# Patient Record
Sex: Female | Born: 1980 | Race: Black or African American | Hispanic: No | Marital: Married | Smoking: Never smoker
Health system: Southern US, Community
[De-identification: ages and names within clinical notes are randomized; demographics above are authoritative.]

---

## 2016-04-08 ENCOUNTER — Emergency Department (HOSPITAL_COMMUNITY)
Admission: EM | Admit: 2016-04-08 | Discharge: 2016-04-08 | Disposition: A | Discharge status: Home / Self Care | Payer: Medicaid Other | Attending: Emergency Medicine | Admitting: Emergency Medicine

## 2016-04-08 ENCOUNTER — Emergency Department (HOSPITAL_COMMUNITY): Payer: Medicaid Other

## 2016-04-08 ENCOUNTER — Encounter (HOSPITAL_COMMUNITY): Payer: Self-pay | Admitting: Emergency Medicine

## 2016-04-08 DIAGNOSIS — N3001 Acute cystitis with hematuria: Secondary | ICD-10-CM

## 2016-04-08 DIAGNOSIS — M545 Low back pain, unspecified: Secondary | ICD-10-CM

## 2016-04-08 DIAGNOSIS — G8929 Other chronic pain: Secondary | ICD-10-CM | POA: Insufficient documentation

## 2016-04-08 LAB — BASIC METABOLIC PANEL WITH GFR
Anion gap: 10 (ref 5–15)
BUN: 12 mg/dL (ref 6–20)
CO2: 23 mmol/L (ref 22–32)
Calcium: 9.5 mg/dL (ref 8.9–10.3)
Chloride: 98 mmol/L — ABNORMAL LOW (ref 101–111)
Creatinine, Ser: 0.78 mg/dL (ref 0.44–1.00)
GFR calc Af Amer: >60 mL/min
GFR calc non Af Amer: >60 mL/min
Glucose, Bld: 109 mg/dL — ABNORMAL HIGH (ref 65–99)
Potassium: 3.8 mmol/L (ref 3.5–5.1)
Sodium: 131 mmol/L — ABNORMAL LOW (ref 135–145)

## 2016-04-08 LAB — URINALYSIS, ROUTINE W REFLEX MICROSCOPIC
Glucose, UA: NEGATIVE mg/dL
LEUKOCYTES UA: NEGATIVE
NITRITE: NEGATIVE
PH: 5.5 (ref 5.0–8.0)
Protein, ur: 30 mg/dL — AB

## 2016-04-08 LAB — URINE MICROSCOPIC-ADD ON

## 2016-04-08 LAB — PREGNANCY, URINE: Preg Test, Ur: NEGATIVE

## 2016-04-08 MED ORDER — TRAMADOL HCL 50 MG PO TABS: 50.0000 mg | ORAL_TABLET | Freq: Four times a day (QID) | ORAL | 0 refills | Status: AC | PRN

## 2016-04-08 MED ORDER — CEPHALEXIN 500 MG PO CAPS: 500.0000 mg | ORAL_CAPSULE | Freq: Four times a day (QID) | ORAL | 0 refills | Status: DC

## 2016-04-08 MED ORDER — TRAMADOL HCL 50 MG PO TABS: 50.0000 mg | ORAL_TABLET | Freq: Four times a day (QID) | ORAL | 0 refills | Status: DC | PRN

## 2016-04-08 MED ORDER — CEPHALEXIN 500 MG PO CAPS: 500.0000 mg | ORAL_CAPSULE | Freq: Three times a day (TID) | ORAL | 0 refills | Status: AC

## 2016-04-08 NOTE — Discharge Instructions (Addendum)
Please take the antibiotics 4 times a day for 7 days. Drink plenty of fluids. You may take the pain medicine as needed. You need to follow up next week with pcp for your urine to be rechecked.

## 2016-04-08 NOTE — ED Triage Notes (Signed)
Pt reports low back pain that she has had for over 6 months. Pt states she was seen by her PCP several months ago. Pt states symptoms have been becoming worse.

## 2016-04-10 LAB — URINE CULTURE

## 2016-04-17 NOTE — ED Provider Notes (Signed)
[facility]-EMERGENCY DEPT Provider Note   CSN: [id number] Arrival date & time: 04/08/16  1319     History   Chief Complaint Chief Complaint  Patient presents with  . Back Pain    HPI Margaret Leon is a 35 y.o. female.  35 yo AA female with pmh sig for chronic low back pain that presents to the ED today for low back pain. Pt states she was seen and evaluated by her PCP approx 1 year ago for same. She received steroid injections for back pain that helped temporarily. Patient states that the pain as gradually worsened. She has not tried anything at home for the pain. Moving makes the pain worse. She has not seen a orthopedist or neurosurgeon. She denies any new trauma. Reports numbness and tingling down bilat legs to the lateral side of the upper thighs that started approx 2 days ago. Denies any groin numbness. She denies history of ivdu or cancer. She denies any fever, chills, ha, vision changes, lightheadedness, dizziness, cp, sob, abd pain, nausea, emesis, urinary symptoms, change in bowel habits, saddle paresthesias, urinary retention, loss of bowel or bladder.      History reviewed. No pertinent past medical history.  There are no active problems to display for this patient.   History reviewed. No pertinent surgical history.  OB History    Gravida Para Term Preterm AB Living   3 1 1   2      SAB TAB Ectopic Multiple Live Births   2               Home Medications    Prior to Admission medications   Medication Sig Start Date End Date Taking? Authorizing Provider  cephALEXin (KEFLEX) 500 MG capsule Take 1 capsule (500 mg total) by mouth 3 (three) times daily. 04/08/16   Rise MuKenneth T Kaz Auld, PA-C  traMADol (ULTRAM) 50 MG tablet Take 1 tablet (50 mg total) by mouth every 6 (six) hours as needed. 04/08/16   Rise MuKenneth T Alanny Rivers, PA-C    Family History History reviewed. No pertinent family history.  Social History Social History  Substance Use Topics  . Smoking  status: Never Smoker  . Smokeless tobacco: Never Used  . Alcohol use Not on file     Comment: occas     Allergies   Patient has no known allergies.   Review of Systems Review of Systems  Constitutional: Negative for chills and fever.  HENT: Negative for congestion.   Eyes: Negative for visual disturbance.  Respiratory: Negative for cough and shortness of breath.   Cardiovascular: Negative for chest pain and palpitations.  Gastrointestinal: Negative for abdominal pain, diarrhea, nausea and vomiting.  Genitourinary: Negative.   Musculoskeletal: Positive for arthralgias and back pain. Negative for gait problem, neck pain and neck stiffness.  Skin: Negative.   Neurological: Negative for dizziness, syncope, weakness, light-headedness, numbness and headaches.  All other systems reviewed and are negative.    Physical Exam Updated Vital Signs BP 138/89 (BP Location: Left Arm)   Pulse 107   Temp 98.1 F (36.7 C) (Oral)   Resp 20   Ht 5\' 3"  (1.6 m)   Wt 70.3 kg   LMP 03/28/2016   SpO2 100%   BMI 27.46 kg/m   Physical Exam  Constitutional: She is oriented to person, place, and time. She appears well-developed and well-nourished. No distress.  HENT:  Head: Normocephalic and atraumatic.  Mouth/Throat: Oropharynx is clear and moist.  Eyes: Conjunctivae and EOM are normal. Pupils are equal,  round, and reactive to light. Right eye exhibits no discharge. Left eye exhibits no discharge. No scleral icterus.  Neck: Normal range of motion. Neck supple. No thyromegaly present.  Musculoskeletal: Normal range of motion.  Midline L spine tenderness. No paraspinal tenderness. Full ROM. No deformity or step off appreciated. Moving all four extremities without any difficulty. Pt able to ambulate. Negative straight leg raise bilateraly.  Lymphadenopathy:    She has no cervical adenopathy.  Neurological: She is alert and oriented to person, place, and time.  The patient is alert, attentive,  and oriented x 3. Speech is clear. Cranial nerve II-VII grossly intact. Negative pronator drift. Sensation intact to sharp and dull and proprieception. Strength 5/5 in all extremities. Reflexes 2+ and symmetric at biceps, triceps, knees, and ankles. Rapid alternating movement and fine finger movements intact. Romberg is absent. Posture and gait normal.   Skin: Skin is warm and dry. Capillary refill takes less than 2 seconds.  Nursing note and vitals reviewed.    ED Treatments / Results  Labs (all labs ordered are listed, but only abnormal results are displayed)   EKG  EKG Interpretation None       Radiology No results found.  Procedures Procedures (including critical care time)  Medications Ordered in ED Medications - No data to display   Initial Impression / Assessment and Plan / ED Course  I have reviewed the triage vital signs and the nursing notes.  Pertinent labs & imaging results that were available during my care of the patient were reviewed by me and considered in my medical decision making (see chart for details).  Clinical Course   Patient with back pain.  No neurological deficits and normal neuro exam.  Patient can walk but states is painful.  No loss of bowel or bladder control.  No concern for cauda equina.  No fever, night sweats, weight loss, h/o cancer, IVDU.  Given patient worsening pain and numbness MRI was ordered that was normal. UA showed signs of infection including hgb and bacteria. Will culture urine. Denies any urinary symptoms. Likely contaminate given many squamous cells. Will treat with abx given MRI of back was normal. Unable to explain pain. No CVA tenderness and afebrile. Low suspicion for pyelo. Patietn is slightly tachy in ED likely due to pain. She also had protein in urine. Encouraged patient to follow up with pcp to have ua recheck after starting abx. Small dose of pain medicine given but feel patient needs referral to ortho if symptoms persist.  Patient also noted to be mildly hyponatremic. I have also encouraged patient to see pcp this week for recheck of sodium. RICE protocol and pain medicine indicated and discussed with patient.  Pt is hemodynamically stable, in NAD, & able to ambulate in the ED. Pain has been managed & has no complaints prior to dc. Pt is comfortable with above plan and is stable for discharge at this time. All questions were answered prior to disposition. Strict return precautions for f/u to the ED were discussed.    Final Clinical Impressions(s) / ED Diagnoses   Final diagnoses:  Low back pain radiating to both legs  Chronic midline low back pain without sciatica  Acute cystitis with hematuria    New Prescriptions Discharge Medication List as of 04/08/2016  5:00 PM       Rise MuKenneth T Dreshon Proffit, PA-C 04/17/16 1550    Heide Scaleshristopher J Tegeler, MD 04/17/16 2149

## 2017-08-16 IMAGING — MR MR LUMBAR SPINE W/O CM
4 of 5 series · 16 of 48 positions shown · non-contrast
Comparison: None.

CLINICAL DATA: Six-month history of low back pain. No radicular
symptoms.

EXAM:
MRI LUMBAR SPINE WITHOUT CONTRAST
TECHNIQUE: Multiplanar, multisequence MR imaging of the lumbar spine was
performed. No intravenous contrast was administered.

[Series 3: T2 · sagittal · 4.0mm · 0.78mm/px · 6 of 15 slices shown (1 of 2)]
[im 1/15]
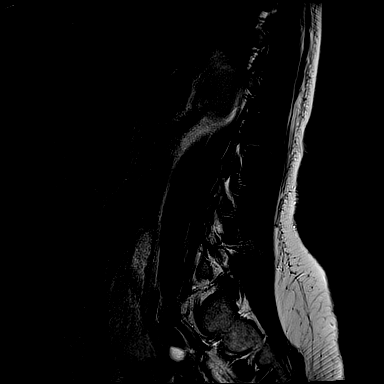
[im 3/15]
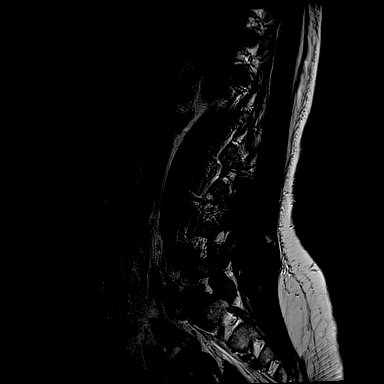
[im 6/15]
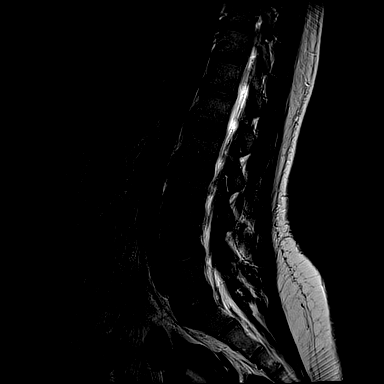
[im 9/15]
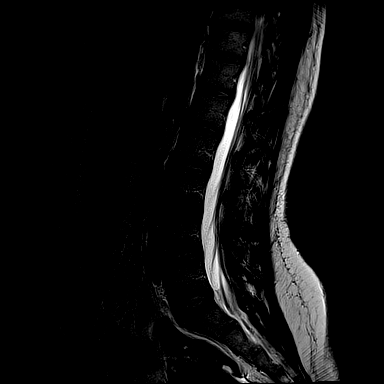
[im 12/15]
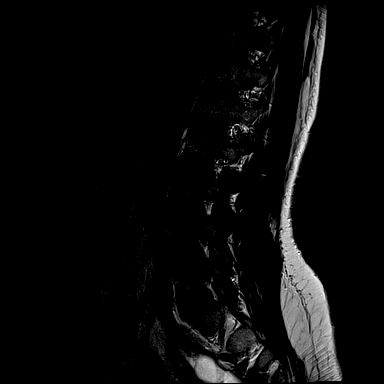
[im 15/15]
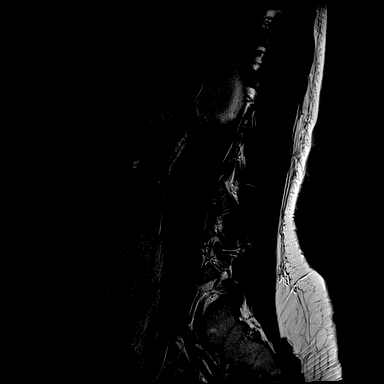

[Series 4: T1 · sagittal · 4.0mm · 0.43mm/px · 3 of 15 slices shown (1 of 2)]
[im 3/15]
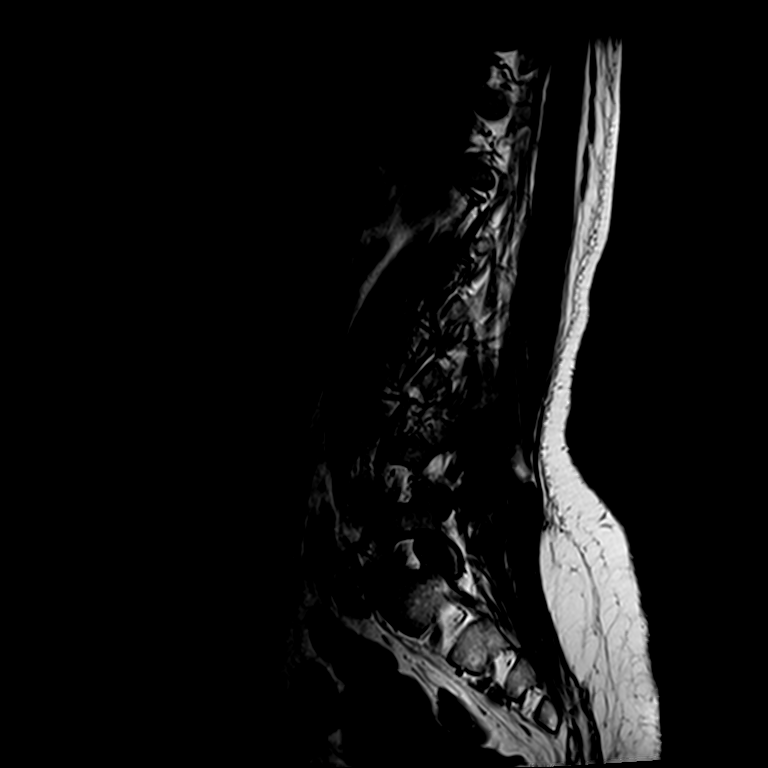
[im 9/15]
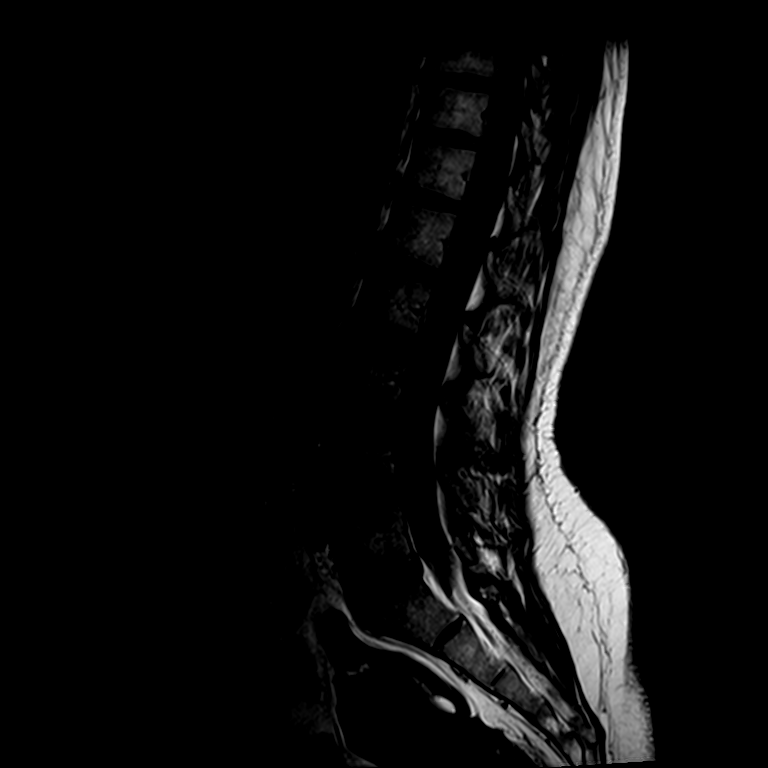
[im 15/15]
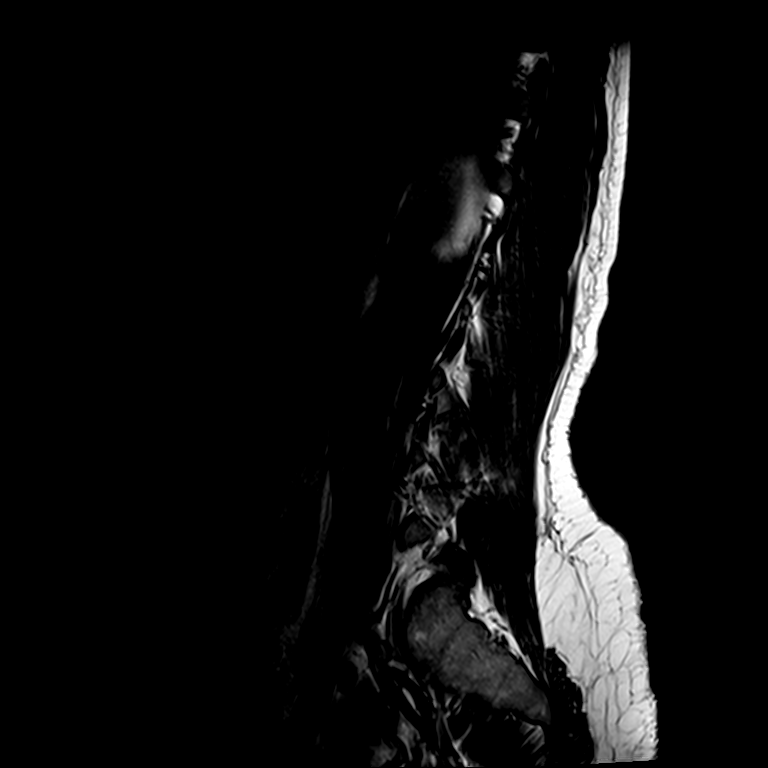

[Series 6: T2 · axial · 4.0mm · 0.20mm/px · z∈[-58,+96]mm · 4 of 36 slices shown (2 of 2)]
[im 1/36]
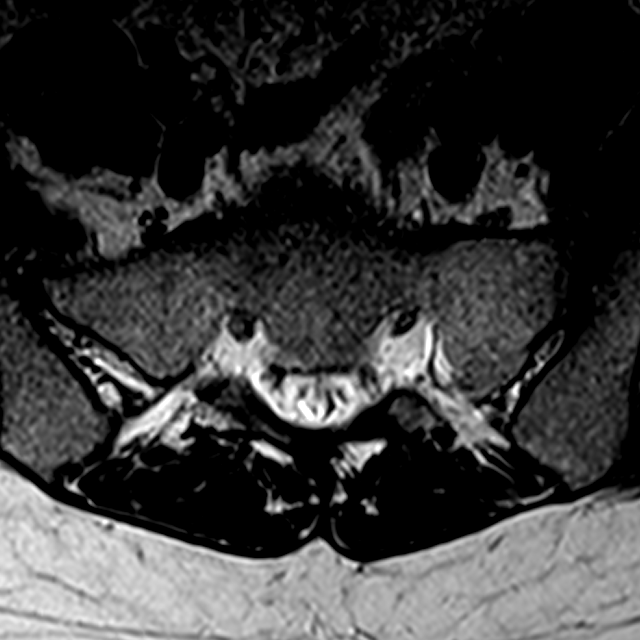
[im 6/36]
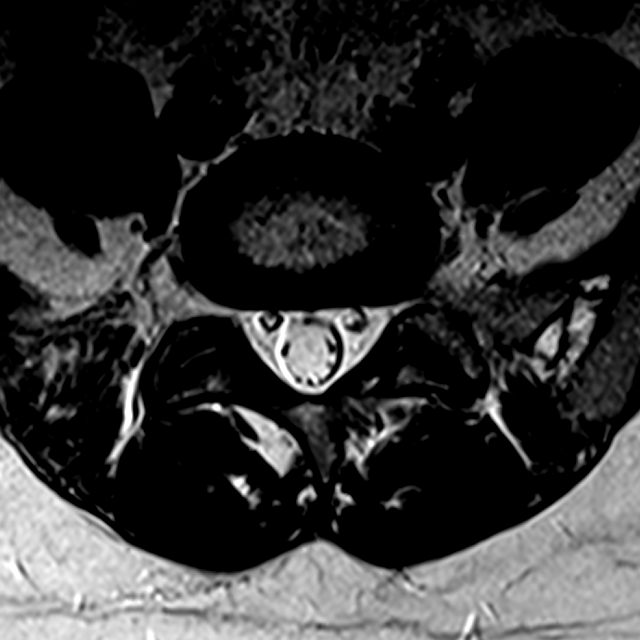
[im 18/36]
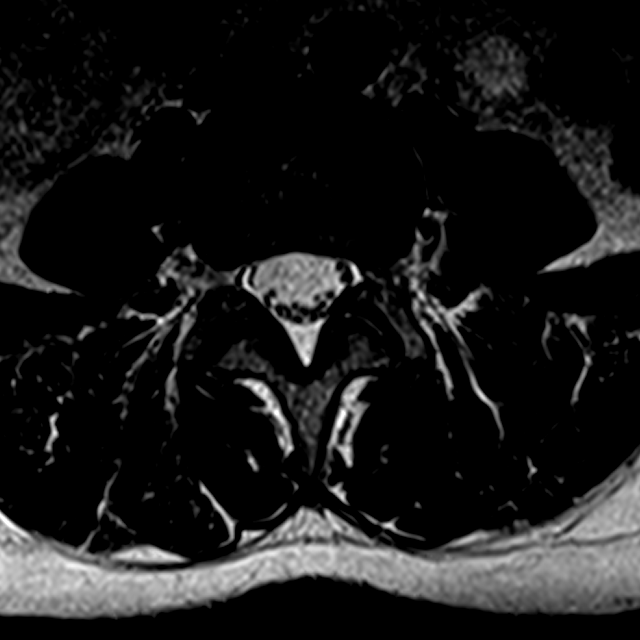
[im 31/36]
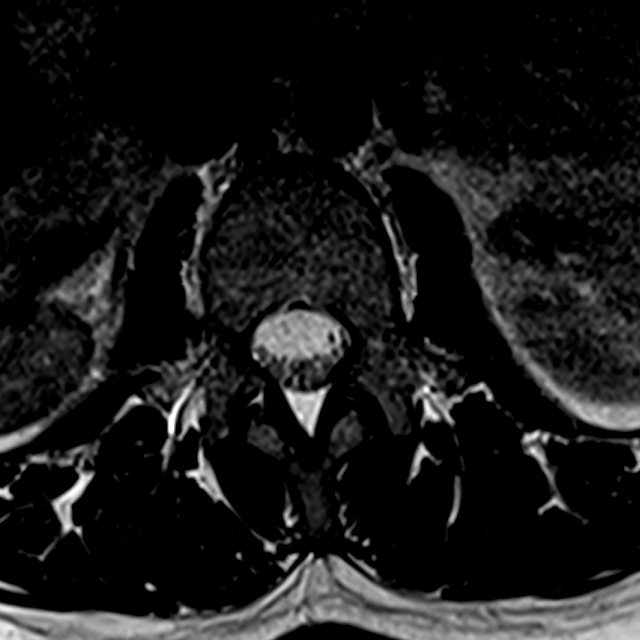

[Series 7: T1 · axial · 4.0mm · 0.20mm/px · z∈[-36,+96]mm · 3 of 36 slices shown (2 of 2)]
[im 6/36]
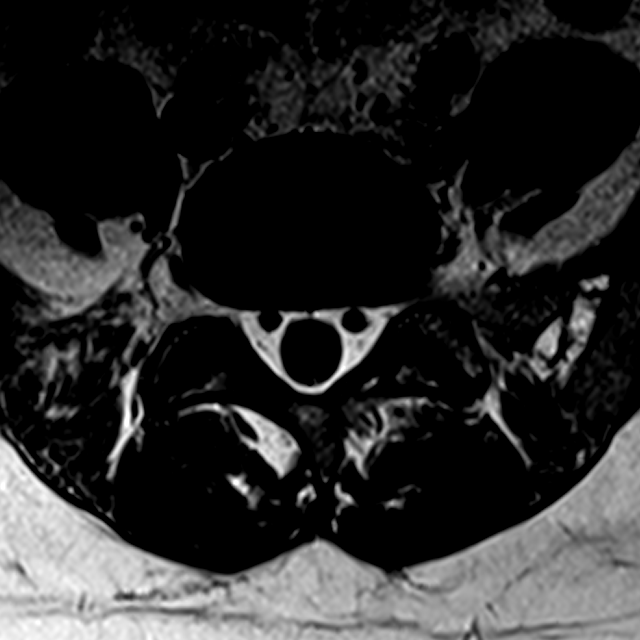
[im 18/36]
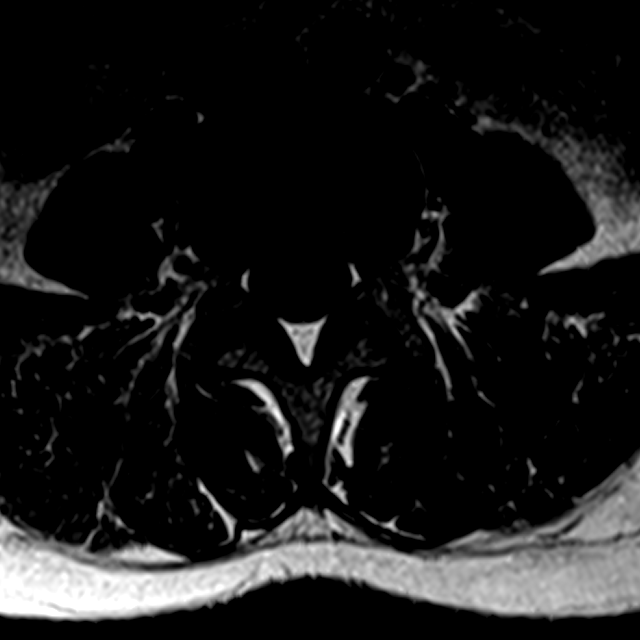
[im 31/36]
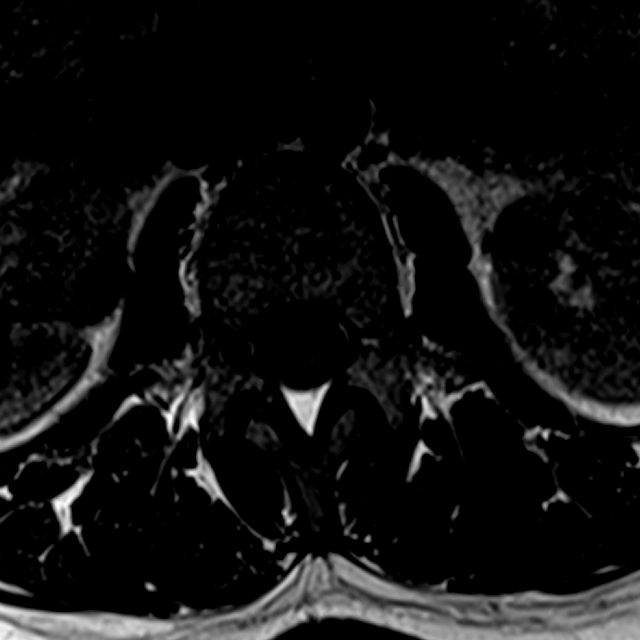

[16 of 48 positions shown; findings below may reference images not displayed]

FINDINGS: Segmentation: 5 lumbar type vertebral bodies. The last full
intervertebral disc space is labeled L5-S1.

Alignment:  Normal

Vertebrae:  Normal marrow signal.  No bone lesions or fracture.

Conus medullaris: Extends to the T12-L1 level and appears normal.

Paraspinal and other soft tissues: No significant findings.

Disc levels:

No focal disc protrusions, spinal or foraminal stenosis.
IMPRESSION: Unremarkable lumbar spine MRI examination. No disc protrusions,
spinal or foraminal stenosis.
